# Patient Record
Sex: Female | Born: 1975 | Race: Black or African American | Hispanic: No | Marital: Single | Smoking: Never smoker
Health system: Southern US, Community
[De-identification: ages and names within clinical notes are randomized; demographics above are authoritative.]

## PROBLEM LIST (undated history)

## (undated) DIAGNOSIS — M1990 Unspecified osteoarthritis, unspecified site: Secondary | ICD-10-CM

## (undated) DIAGNOSIS — F419 Anxiety disorder, unspecified: Secondary | ICD-10-CM

## (undated) DIAGNOSIS — M542 Cervicalgia: Secondary | ICD-10-CM

## (undated) DIAGNOSIS — I10 Essential (primary) hypertension: Secondary | ICD-10-CM

## (undated) DIAGNOSIS — M62838 Other muscle spasm: Secondary | ICD-10-CM

## (undated) DIAGNOSIS — M797 Fibromyalgia: Secondary | ICD-10-CM

## (undated) HISTORY — DX: Anxiety disorder, unspecified: F41.9

## (undated) HISTORY — DX: Other muscle spasm: M62.838

## (undated) HISTORY — DX: Fibromyalgia: M79.7

## (undated) HISTORY — DX: Cervicalgia: M54.2

## (undated) HISTORY — DX: Unspecified osteoarthritis, unspecified site: M19.90

---

## 2014-11-27 DIAGNOSIS — N939 Abnormal uterine and vaginal bleeding, unspecified: Secondary | ICD-10-CM | POA: Insufficient documentation

## 2014-11-27 DIAGNOSIS — N390 Urinary tract infection, site not specified: Secondary | ICD-10-CM | POA: Insufficient documentation

## 2014-11-27 DIAGNOSIS — R102 Pelvic and perineal pain: Secondary | ICD-10-CM | POA: Insufficient documentation

## 2014-11-27 DIAGNOSIS — N938 Other specified abnormal uterine and vaginal bleeding: Secondary | ICD-10-CM | POA: Insufficient documentation

## 2015-10-25 DIAGNOSIS — Z8041 Family history of malignant neoplasm of ovary: Secondary | ICD-10-CM | POA: Insufficient documentation

## 2015-12-08 DIAGNOSIS — Z90710 Acquired absence of both cervix and uterus: Secondary | ICD-10-CM | POA: Insufficient documentation

## 2019-12-30 ENCOUNTER — Emergency Department (HOSPITAL_COMMUNITY): Payer: No Typology Code available for payment source

## 2019-12-30 ENCOUNTER — Emergency Department (HOSPITAL_COMMUNITY)
Admission: EM | Admit: 2019-12-30 | Discharge: 2019-12-30 | Disposition: A | Discharge status: Home / Self Care | Payer: No Typology Code available for payment source | Attending: Emergency Medicine | Admitting: Emergency Medicine

## 2019-12-30 DIAGNOSIS — Y999 Unspecified external cause status: Secondary | ICD-10-CM | POA: Diagnosis not present

## 2019-12-30 DIAGNOSIS — Y9389 Activity, other specified: Secondary | ICD-10-CM | POA: Diagnosis not present

## 2019-12-30 DIAGNOSIS — M542 Cervicalgia: Secondary | ICD-10-CM | POA: Insufficient documentation

## 2019-12-30 DIAGNOSIS — M79601 Pain in right arm: Secondary | ICD-10-CM | POA: Diagnosis not present

## 2019-12-30 DIAGNOSIS — M79641 Pain in right hand: Secondary | ICD-10-CM

## 2019-12-30 DIAGNOSIS — Y92410 Unspecified street and highway as the place of occurrence of the external cause: Secondary | ICD-10-CM | POA: Diagnosis not present

## 2019-12-30 MED ORDER — NAPROXEN 250 MG PO TABS
500.0000 mg | ORAL_TABLET | Freq: Once | ORAL | Status: AC
  Administered 2019-12-30: 500 mg via ORAL
  Filled 2019-12-30: qty 2

## 2019-12-30 MED ORDER — CYCLOBENZAPRINE HCL 10 MG PO TABS: 10.0000 mg | ORAL_TABLET | Freq: Three times a day (TID) | ORAL | 0 refills | Status: AC | PRN

## 2019-12-30 MED ORDER — NAPROXEN 375 MG PO TABS: 375.0000 mg | ORAL_TABLET | Freq: Two times a day (BID) | ORAL | 0 refills | Status: AC

## 2019-12-30 NOTE — Discharge Instructions (Signed)
Your x-ray today was within normal limits.  We placed your right wrist on a splint, please keep this on for comfort.  I prescribed a short course of anti-inflammatories along with muscle relaxers to help with your pain, please be aware this medication can make you drowsy, do not drink alcohol or drive while taking this medication.  The number to the [facility] health and wellness clinic is attached to your chart, please schedule an appointment for further follow-up on your blood pressure.
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
## 2019-12-30 NOTE — ED Notes (Signed)
Wrist brace applied, discharge paperwork given. Pt left before vitals could be obtained.

## 2020-04-11 ENCOUNTER — Encounter (HOSPITAL_COMMUNITY): Payer: Self-pay | Admitting: Emergency Medicine

## 2020-04-11 ENCOUNTER — Emergency Department (HOSPITAL_COMMUNITY)
Admission: EM | Admit: 2020-04-11 | Discharge: 2020-04-11 | Disposition: A | Discharge status: Home / Self Care | Payer: Self-pay | Attending: Emergency Medicine | Admitting: Emergency Medicine

## 2020-04-11 ENCOUNTER — Other Ambulatory Visit: Payer: Self-pay

## 2020-04-11 ENCOUNTER — Emergency Department (HOSPITAL_COMMUNITY): Payer: Self-pay

## 2020-04-11 DIAGNOSIS — N390 Urinary tract infection, site not specified: Secondary | ICD-10-CM

## 2020-04-11 LAB — URINALYSIS, ROUTINE W REFLEX MICROSCOPIC
Bilirubin Urine: NEGATIVE
Glucose, UA: NEGATIVE mg/dL
Ketones, ur: NEGATIVE mg/dL
Nitrite: NEGATIVE
Protein, ur: NEGATIVE mg/dL
Specific Gravity, Urine: 1.004 — ABNORMAL LOW (ref 1.005–1.030)
pH: 7 (ref 5.0–8.0)

## 2020-04-11 LAB — COMPREHENSIVE METABOLIC PANEL WITH GFR
ALT: 20 U/L (ref 0–44)
AST: 16 U/L (ref 15–41)
Albumin: 4 g/dL (ref 3.5–5.0)
Alkaline Phosphatase: 57 U/L (ref 38–126)
Anion gap: 10 (ref 5–15)
BUN: 9 mg/dL (ref 6–20)
CO2: 24 mmol/L (ref 22–32)
Calcium: 9.5 mg/dL (ref 8.9–10.3)
Chloride: 102 mmol/L (ref 98–111)
Creatinine, Ser: 0.8 mg/dL (ref 0.44–1.00)
GFR calc Af Amer: >60 mL/min
GFR calc non Af Amer: >60 mL/min
Glucose, Bld: 89 mg/dL (ref 70–99)
Potassium: 3.7 mmol/L (ref 3.5–5.1)
Sodium: 136 mmol/L (ref 135–145)
Total Bilirubin: 0.7 mg/dL (ref 0.3–1.2)
Total Protein: 7.3 g/dL (ref 6.5–8.1)

## 2020-04-11 LAB — CBC
HCT: 38.6 % (ref 36.0–46.0)
Hemoglobin: 12.2 g/dL (ref 12.0–15.0)
MCH: 28.2 pg (ref 26.0–34.0)
MCHC: 31.6 g/dL (ref 30.0–36.0)
MCV: 89.1 fL (ref 80.0–100.0)
Platelets: 307 10*3/uL (ref 150–400)
RBC: 4.33 MIL/uL (ref 3.87–5.11)
RDW: 14.2 % (ref 11.5–15.5)
WBC: 11.1 10*3/uL — ABNORMAL HIGH (ref 4.0–10.5)
nRBC: 0 % (ref 0.0–0.2)

## 2020-04-11 LAB — LIPASE, BLOOD: Lipase: 29 U/L (ref 11–51)

## 2020-04-11 LAB — I-STAT BETA HCG BLOOD, ED (MC, WL, AP ONLY): I-stat hCG, quantitative: <5 m[IU]/mL (ref <5)

## 2020-04-11 MED ORDER — CEPHALEXIN 500 MG PO CAPS: 500.0000 mg | ORAL_CAPSULE | Freq: Two times a day (BID) | ORAL | 0 refills | Status: AC

## 2020-04-11 MED ORDER — AMLODIPINE BESYLATE 10 MG PO TABS: 10.0000 mg | ORAL_TABLET | Freq: Every day | ORAL | 0 refills | Status: AC

## 2020-04-11 MED ORDER — SODIUM CHLORIDE 0.9% FLUSH: 3.0000 mL | Freq: Once | INTRAVENOUS | Status: DC

## 2020-04-11 MED ORDER — GABAPENTIN 100 MG PO CAPS: 100.0000 mg | ORAL_CAPSULE | Freq: Three times a day (TID) | ORAL | 0 refills | Status: AC

## 2020-04-11 MED ORDER — CEPHALEXIN 250 MG PO CAPS
500.0000 mg | ORAL_CAPSULE | Freq: Once | ORAL | Status: AC
  Administered 2020-04-11: 500 mg via ORAL
  Filled 2020-04-11: qty 2

## 2020-04-11 NOTE — Discharge Instructions (Signed)
Try to establish care with a primary care provider soon as possible.  Take the antibiotics as prescribed.  Return here as needed if you have any worsening symptoms.

## 2020-04-11 NOTE — ED Provider Notes (Signed)
[facility] EMERGENCY DEPARTMENT Provider Note   CSN: [id number] Arrival date & time: 04/11/20  1409     History Chief Complaint  Patient presents with  . Abdominal Pain  . Urinary Frequency    Donna Vance is a 44 y.o. female.  Patient is a 44 year old female who presents with right flank pain.  She has had 2-week history of intermittent pain in her right back that radiates to her right lower abdomen.  She has had some urinary frequency and burning on urination.  Her symptoms got worse over the last 2 to 3 days.  She denies any known fevers.  No nausea or vomiting.  No known history of kidney stones.  He does have a history of hypertension and is been out of her medicine for several months.  She currently does not have a PCP in town.  She recently moved from [place].  She has been using ibuprofen for her flank pain now has a little bit of increased reflux symptoms.  She is status post hysterectomy and denies vaginal complaints.        History reviewed. No pertinent past medical history.  There are no problems to display for this patient.   History reviewed. No pertinent surgical history.   OB History   No obstetric history on file.     No family history on file.  Social History   Tobacco Use  . Smoking status: Never Smoker  . Smokeless tobacco: Never Used  Substance Use Topics  . Alcohol use: Yes  . Drug use: Never    Home Medications Prior to Admission medications   Medication Sig Start Date End Date Taking? Authorizing Provider  amLODipine (NORVASC) 10 MG tablet Take 1 tablet (10 mg total) by mouth daily. 04/11/20   Rolan Bucco, MD  cephALEXin (KEFLEX) 500 MG capsule Take 1 capsule (500 mg total) by mouth 2 (two) times daily. 04/11/20   Rolan Bucco, MD  gabapentin (NEURONTIN) 100 MG capsule Take 1 capsule (100 mg total) by mouth 3 (three) times daily. 04/11/20   Rolan Bucco, MD    Allergies    Morphine and Latex  Review of  Systems   Review of Systems  Constitutional: Negative for chills, diaphoresis, fatigue and fever.  HENT: Negative for congestion, rhinorrhea and sneezing.   Eyes: Negative.   Respiratory: Negative for cough, chest tightness and shortness of breath.   Cardiovascular: Negative for chest pain and leg swelling.  Gastrointestinal: Positive for abdominal pain. Negative for blood in stool, diarrhea, nausea and vomiting.  Genitourinary: Positive for dysuria, flank pain and frequency. Negative for difficulty urinating and hematuria.  Musculoskeletal: Positive for back pain. Negative for arthralgias.  Skin: Negative for rash.  Neurological: Negative for dizziness, speech difficulty, weakness, numbness and headaches.    Physical Exam Updated Vital Signs BP (!) 177/104   Pulse 69   Temp 98.1 F (36.7 C) (Oral)   Resp 18   Ht 5\' 7"  (1.702 m)   Wt 81.6 kg   SpO2 100%   BMI 28.18 kg/m   Physical Exam Constitutional:      Appearance: She is well-developed.  HENT:     Head: Normocephalic and atraumatic.  Eyes:     Pupils: Pupils are equal, round, and reactive to light.  Cardiovascular:     Rate and Rhythm: Normal rate and regular rhythm.     Heart sounds: Normal heart sounds.  Pulmonary:     Effort: Pulmonary effort is normal. No respiratory  distress.     Breath sounds: Normal breath sounds. No wheezing or rales.  Chest:     Chest wall: No tenderness.  Abdominal:     General: Bowel sounds are normal.     Palpations: Abdomen is soft.     Tenderness: There is abdominal tenderness in the right lower quadrant. There is no guarding or rebound.  Musculoskeletal:        General: Normal range of motion.     Cervical back: Normal range of motion and neck supple.  Lymphadenopathy:     Cervical: No cervical adenopathy.  Skin:    General: Skin is warm and dry.     Findings: No rash.  Neurological:     Mental Status: She is alert and oriented to person, place, and time.     ED Results /  Procedures / Treatments   Labs (all labs ordered are listed, but only abnormal results are displayed) Labs Reviewed  CBC - Abnormal; Notable for the following components:      Result Value   WBC 11.1 (*)    All other components within normal limits  URINALYSIS, ROUTINE W REFLEX MICROSCOPIC - Abnormal; Notable for the following components:   APPearance CLOUDY (*)    Specific Gravity, Urine 1.004 (*)    Hgb urine dipstick SMALL (*)    Leukocytes,Ua TRACE (*)    Bacteria, UA RARE (*)    All other components within normal limits  URINE CULTURE  LIPASE, BLOOD  COMPREHENSIVE METABOLIC PANEL  I-STAT BETA HCG BLOOD, ED ([facility], [facility], [facility] ONLY)    EKG None  Radiology CT Renal Stone Study  Result Date: 04/11/2020 CLINICAL DATA:  Right flank pain radiating to the right lower quadrant EXAM: CT ABDOMEN AND PELVIS WITHOUT CONTRAST TECHNIQUE: Multidetector CT imaging of the abdomen and pelvis was performed following the standard protocol without IV contrast. COMPARISON:  None. FINDINGS: Lower chest: Lung bases are clear. Normal heart size. No pericardial effusion. Hepatobiliary: No visible focal liver lesions on this unenhanced CT. Normal liver attenuation with smooth surface contour. Gallbladder and biliary tree are unremarkable. Pancreas: Unremarkable. No pancreatic ductal dilatation or surrounding inflammatory changes. Spleen: Normal in size. No concerning splenic lesions. Adrenals/Urinary Tract: Normal adrenal glands. Kidneys are symmetric in size and normally located. No visible concerning renal lesions. No visible urolithiasis or hydronephrosis. Urinary bladder is largely decompressed at the time of exam and therefore poorly evaluated by CT imaging. No gross bladder abnormalities. Stomach/Bowel: Distal esophagus, stomach and duodenal sweep are unremarkable. No small bowel wall thickening or dilatation. No evidence of obstruction. A normal appendix is visualized. No colonic dilatation or wall thickening.  Vascular/Lymphatic: No significant vascular findings are present. No enlarged abdominal or pelvic lymph nodes. Reproductive: Uterus appears to be surgically absent. Correlate with surgical history. No concerning adnexal lesions. Other: No abdominopelvic free fluid or free gas. No bowel containing hernias. Small fat containing umbilical hernia. Musculoskeletal: No acute osseous abnormality or suspicious osseous lesion. Disc height loss with vacuum disc phenomenon L5-S1. IMPRESSION: 1. No acute intra-abdominal process to provide cause for patient's symptoms. Specifically, no visible urolithiasis or hydronephrosis with a normal appendix. Electronically Signed   By: Kreg Shropshire M.D.   On: 04/11/2020 22:32    Procedures Procedures (including critical care time)  Medications Ordered in ED Medications  sodium chloride flush (NS) 0.9 % injection 3 mL (has no administration in time range)  cephALEXin (KEFLEX) capsule 500 mg (has no administration in time range)    ED  Course  I have reviewed the triage vital signs and the nursing notes.  Pertinent labs & imaging results that were available during my care of the patient were reviewed by me and considered in my medical decision making (see chart for details).    MDM Rules/Calculators/A&P                          Patient is a 44 year old female who presents with right flank pain.  She also has some UTI symptoms.  Her urine has trace leukocyte esterase, few bacteria and moderate white blood cells.  It also had hematuria and given this, CT scan was performed to assess for kidney stones.  There is no evidence of kidney stones or hydronephrosis.  Her appendix appeared normal on CT scan.  No other acute abnormalities.  Her labs are reviewed and are nonconcerning.  She has normal renal function.  She is otherwise well-appearing.  She was discharged home in good condition.  She was started on Keflex for possible UTI.  She was given information about establishing  care with a PCP.  She was given a month supply of her outpatient medicines including amlodipine and gabapentin.  Return precautions were given. Final Clinical Impression(s) / ED Diagnoses Final diagnoses:  Lower urinary tract infectious disease    Rx / DC Orders ED Discharge Orders         Ordered    cephALEXin (KEFLEX) 500 MG capsule  2 times daily     Discontinue  Reprint     04/11/20 2244    amLODipine (NORVASC) 10 MG tablet  Daily     Discontinue  Reprint     04/11/20 2244    gabapentin (NEURONTIN) 100 MG capsule  3 times daily     Discontinue  Reprint     04/11/20 2244           Rolan Bucco, MD 04/11/20 2249

## 2020-04-11 NOTE — ED Notes (Signed)
Patient transported to CT 

## 2020-04-11 NOTE — ED Triage Notes (Signed)
Pt in w/RLQ pain x 1 mo, worsened since last night. States she has been trying home remedies to avoid the ED, but pain is 10/10, and she is n/v. Reporting frequent, scant urination as well. Had hysterectomy 17', still has appendix

## 2020-04-13 LAB — URINE CULTURE
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
## 2021-08-01 IMAGING — CT CT RENAL STONE PROTOCOL
2 of 4 series · 16 of 46 positions shown, 18 images · non-contrast
Comparison: None.

CLINICAL DATA: Right flank pain radiating to the right lower
quadrant

EXAM:
CT ABDOMEN AND PELVIS WITHOUT CONTRAST
TECHNIQUE: Multidetector CT imaging of the abdomen and pelvis was performed
following the standard protocol without IV contrast.

[Series 3: renal stone 5.0 · axial · 0.74mm/px · z∈[+879,+1274]mm · 13 of 87 slices shown, 15 images]
[im 4/87  soft-tissue]
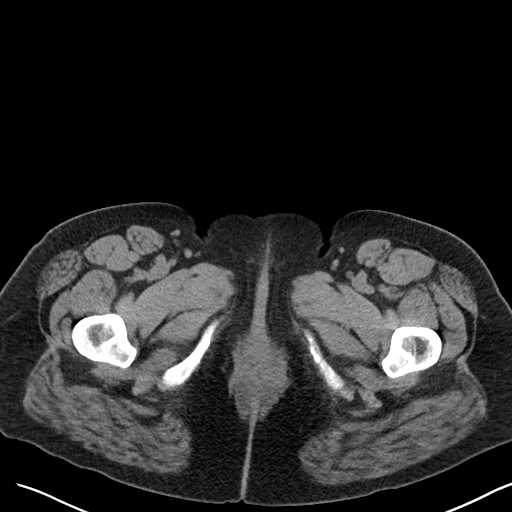
[im 4/87  bone]
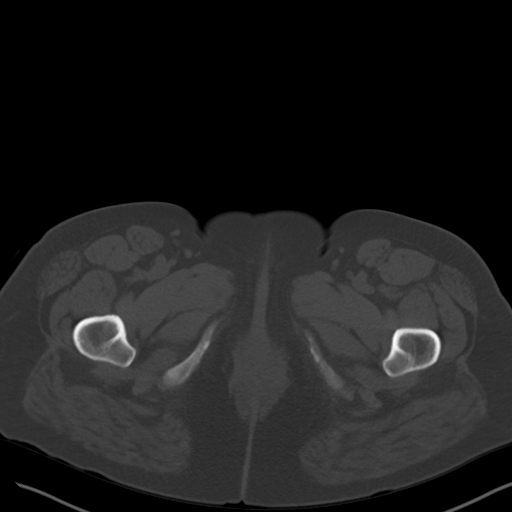
[im 11/87  soft-tissue]
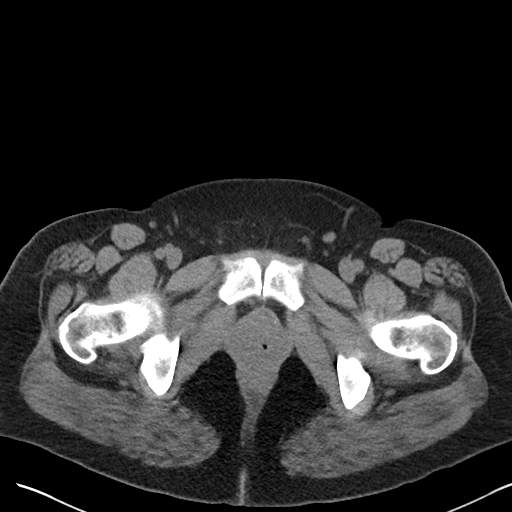
[im 18/87  soft-tissue]
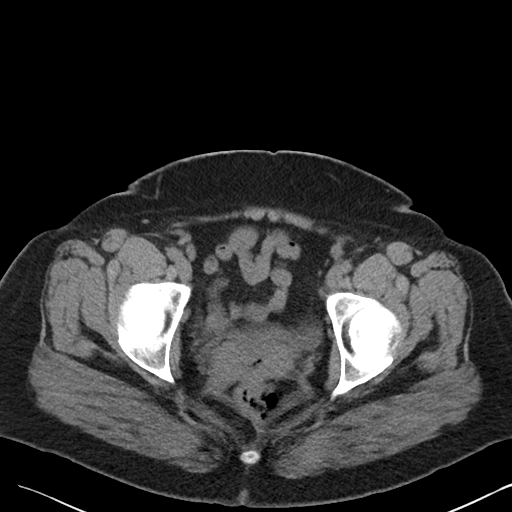
[im 25/87  soft-tissue]
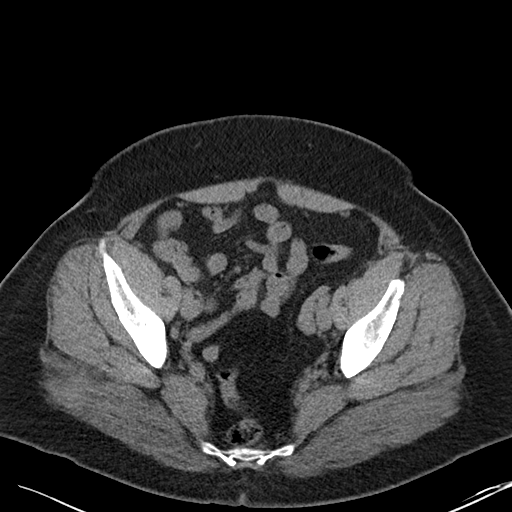
[im 31/87  soft-tissue]
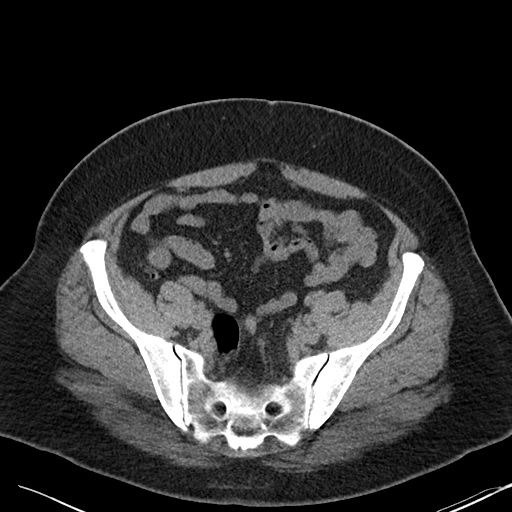
[im 38/87  soft-tissue]
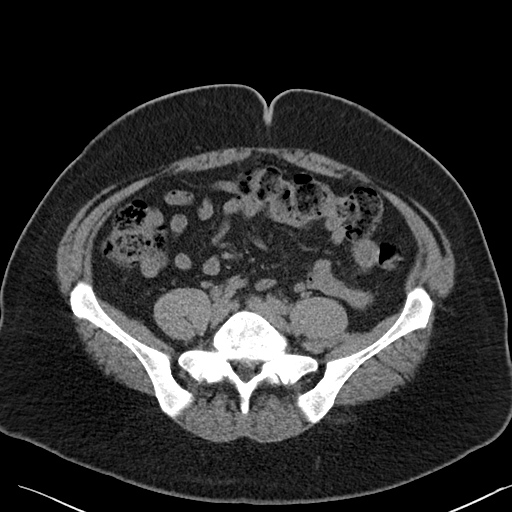
[im 45/87  soft-tissue]
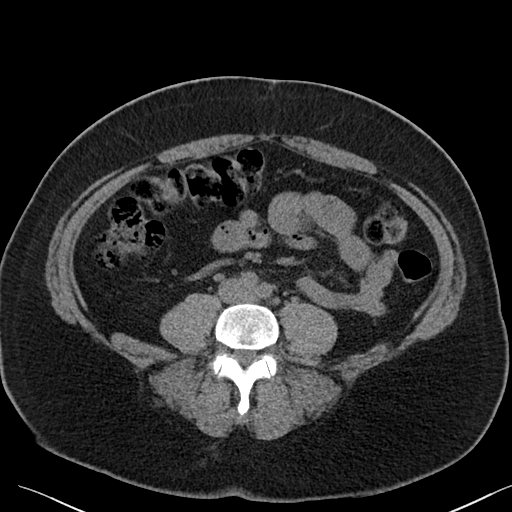
[im 49/87  soft-tissue]
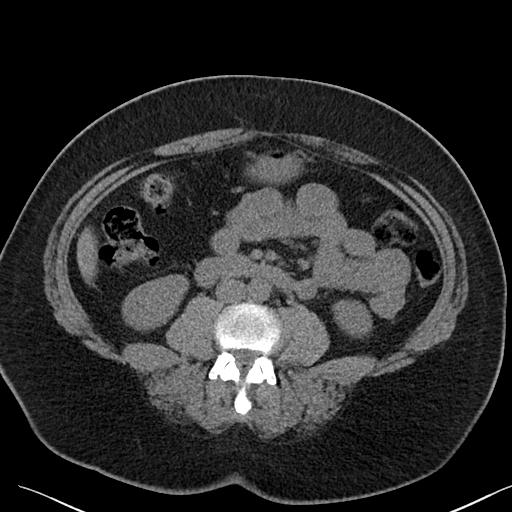
[im 56/87  soft-tissue]
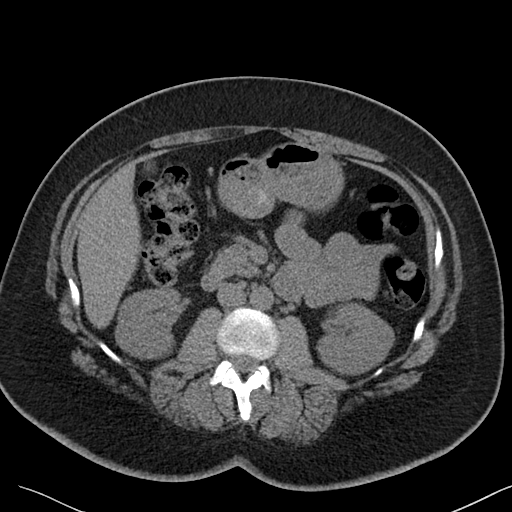
[im 56/87  bone]
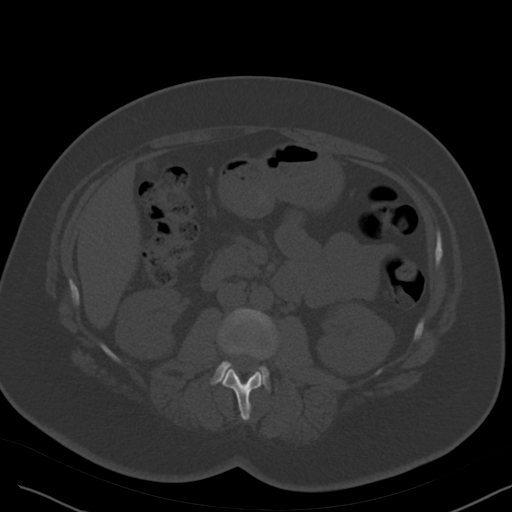
[im 62/87  soft-tissue]
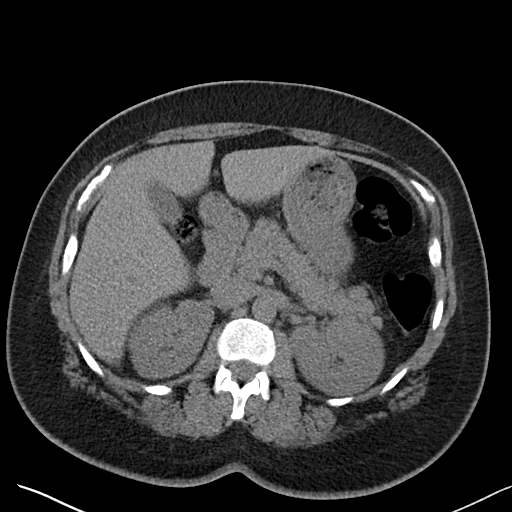
[im 69/87  soft-tissue]
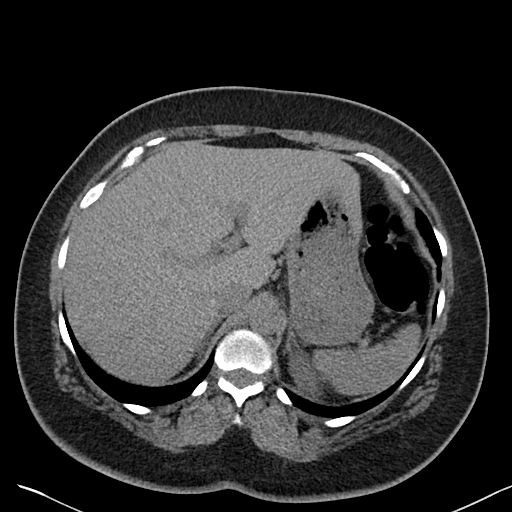
[im 76/87  soft-tissue]
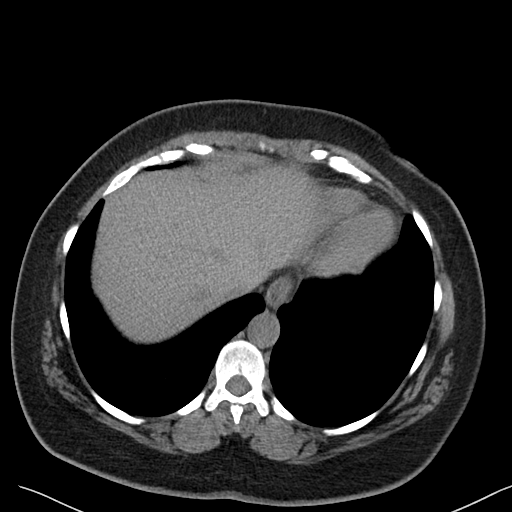
[im 83/87  soft-tissue]
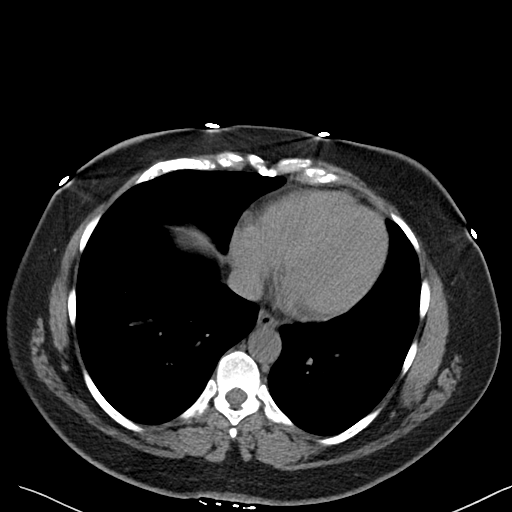

[Series 6: coronal · coronal · 0.77mm/px · 3 of 101 slices shown]
[im 34/101  soft-tissue]
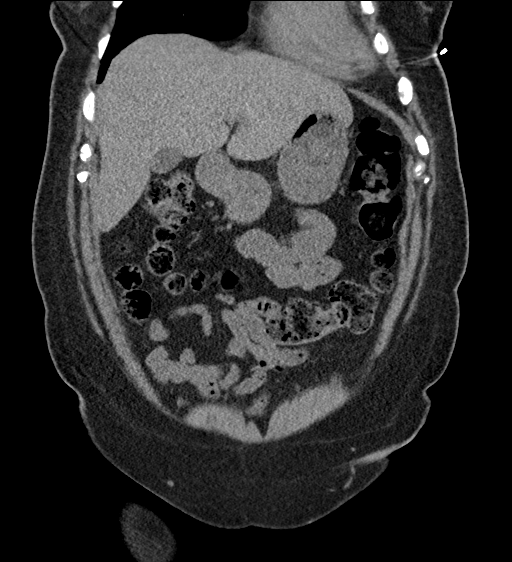
[im 45/101  soft-tissue]
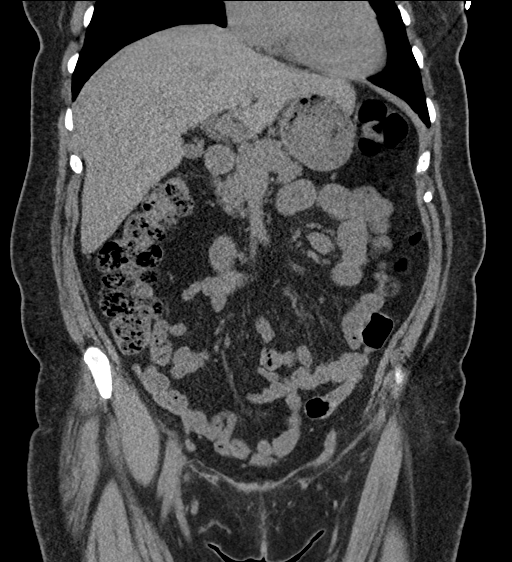
[im 56/101  soft-tissue]
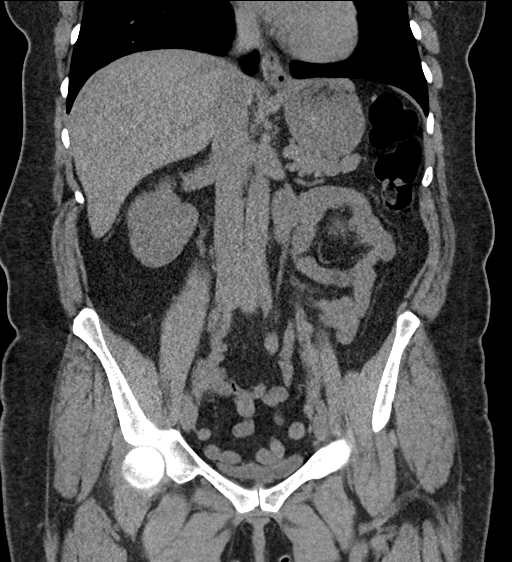

[16 of 46 positions shown; findings below may reference images not displayed]

FINDINGS: Lower chest: Lung bases are clear. Normal heart size. No pericardial
effusion.

Hepatobiliary: No visible focal liver lesions on this unenhanced CT.
Normal liver attenuation with smooth surface contour. Gallbladder
and biliary tree are unremarkable.

Pancreas: Unremarkable. No pancreatic ductal dilatation or
surrounding inflammatory changes.

Spleen: Normal in size. No concerning splenic lesions.

Adrenals/Urinary Tract: Normal adrenal glands. Kidneys are symmetric
in size and normally located. No visible concerning renal lesions.
No visible urolithiasis or hydronephrosis. Urinary bladder is
largely decompressed at the time of exam and therefore poorly
evaluated by CT imaging. No gross bladder abnormalities.

Stomach/Bowel: Distal esophagus, stomach and duodenal sweep are
unremarkable. No small bowel wall thickening or dilatation. No
evidence of obstruction. A normal appendix is visualized. No colonic
dilatation or wall thickening.

Vascular/Lymphatic: No significant vascular findings are present. No
enlarged abdominal or pelvic lymph nodes.

Reproductive: Uterus appears to be surgically absent. Correlate with
surgical history. No concerning adnexal lesions.

Other: No abdominopelvic free fluid or free gas. No bowel containing
hernias. Small fat containing umbilical hernia.

Musculoskeletal: No acute osseous abnormality or suspicious osseous
lesion. Disc height loss with vacuum disc phenomenon L5-S1.
IMPRESSION: 1. No acute intra-abdominal process to provide cause for patient's
symptoms. Specifically, no visible urolithiasis or hydronephrosis
with a normal appendix.

## 2023-01-22 ENCOUNTER — Encounter: Payer: Self-pay | Admitting: Physical Medicine and Rehabilitation

## 2023-01-22 ENCOUNTER — Ambulatory Visit (INDEPENDENT_AMBULATORY_CARE_PROVIDER_SITE_OTHER): Payer: Medicaid Other | Admitting: Physical Medicine and Rehabilitation

## 2023-01-22 DIAGNOSIS — M542 Cervicalgia: Secondary | ICD-10-CM

## 2023-01-22 DIAGNOSIS — M5416 Radiculopathy, lumbar region: Secondary | ICD-10-CM | POA: Diagnosis not present

## 2023-01-22 DIAGNOSIS — M797 Fibromyalgia: Secondary | ICD-10-CM | POA: Diagnosis not present

## 2023-01-22 DIAGNOSIS — M7918 Myalgia, other site: Secondary | ICD-10-CM

## 2023-01-22 MED ORDER — METHOCARBAMOL 500 MG PO TABS: 500.0000 mg | ORAL_TABLET | Freq: Three times a day (TID) | ORAL | 0 refills | Status: AC

## 2023-01-22 MED ORDER — MELOXICAM 15 MG PO TABS: 15.0000 mg | ORAL_TABLET | Freq: Every day | ORAL | 0 refills | Status: AC

## 2023-01-22 NOTE — Progress Notes (Unsigned)
Functional Pain Scale - descriptive words and definitions  Distressing (6)    Pain is present/unable to complete most ADLs limited by pain/sleep is difficult and active distraction is only marginal. Moderate range order  Average Pain  varies  Neck pain that radiates into upper back and shoulders. Hands, mainly right hand, can go numb
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
## 2023-02-06 ENCOUNTER — Telehealth: Payer: Self-pay

## 2023-02-06 NOTE — Telephone Encounter (Signed)
LVM to return call to schedule OV 

## 2023-02-06 NOTE — Telephone Encounter (Signed)
-----   Message from Donna Chance, NP sent at 01/22/2023  3:59 PM EDT ----- Can we schedule 6 week follow up? Thanks

## 2023-02-13 ENCOUNTER — Ambulatory Visit: Payer: 59 | Attending: Physical Medicine and Rehabilitation | Admitting: Physical Therapy

## 2023-02-13 ENCOUNTER — Other Ambulatory Visit: Payer: Self-pay

## 2023-02-13 ENCOUNTER — Encounter: Payer: Self-pay | Admitting: Physical Therapy

## 2023-02-13 DIAGNOSIS — M6281 Muscle weakness (generalized): Secondary | ICD-10-CM | POA: Diagnosis not present

## 2023-02-13 DIAGNOSIS — M5416 Radiculopathy, lumbar region: Secondary | ICD-10-CM | POA: Insufficient documentation

## 2023-02-13 DIAGNOSIS — M5459 Other low back pain: Secondary | ICD-10-CM | POA: Diagnosis not present

## 2023-02-13 DIAGNOSIS — M7918 Myalgia, other site: Secondary | ICD-10-CM | POA: Diagnosis not present

## 2023-02-13 DIAGNOSIS — M542 Cervicalgia: Secondary | ICD-10-CM
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
## 2023-02-19 ENCOUNTER — Telehealth: Payer: Self-pay | Admitting: Physical Therapy

## 2023-02-19 ENCOUNTER — Ambulatory Visit: Payer: 59 | Admitting: Physical Therapy

## 2023-02-19 NOTE — Therapy (Deleted)
Daily Note   Patient Name: Donna Vance MRN: [id number] DOB:May 25, 1976, 47 y.o., female Today's Date: 02/19/2023     No past medical history on file. No past surgical history on file. Patient Active Problem List   Diagnosis Date Noted   S/P vaginal hysterectomy 12/08/2015   FH: ovarian cancer in first degree relative 10/25/2015   Dysfunctional uterine bleeding 11/27/2014   Pelvic pain 11/27/2014   Urinary tract infection 11/27/2014   Vaginal bleeding 11/27/2014    PCP: Harlen Labs, NP  REFERRING PROVIDER: Harlen Labs, NP  THERAPY DIAG:  No diagnosis found.  REFERRING DIAG: Cervicalgia [M54.2], Myofascial pain syndrome [M79.18], Lumbar radiculopathy [M54.16]   Rationale for Evaluation and Treatment:  Rehabilitation  SUBJECTIVE:  PERTINENT PAST HISTORY:  none        PRECAUTIONS: None  WEIGHT BEARING RESTRICTIONS No  FALLS:  Has patient fallen in last 6 months? No, Number of falls: 0  MOI/History of condition:  Onset date: 2021  SUBJECTIVE STATEMENT  ***  Pain:  Are you having pain? Yes Pain location: neck, UT to mid thoracic spine NPRS scale:  2/10 to 9/10 Aggravating factors: clean house (vigorous), work (on computer all day) Relieving factors: muscle relaxer's Pain description: sharp and aching Stage: Chronic Stability: staying the same  Patient Goals/Specific Activities: improve pain   OBJECTIVE:   DIAGNOSTIC FINDINGS:  None recent   GENERAL OBSERVATION:  Slight forward head rounded shoulders     SENSATION:  Light touch: Appears intact   PALPATION: TTP sub occipitals, bil UT, bil LS  LUMBAR AROM  AROM AROM  02/19/2023  Flexion Fingertips to toes (WNL)  Extension WNL  Right lateral flexion WNL  Left lateral flexion WNL  Right rotation limited by 25%  Left rotation limited by 25%    (Blank rows = not tested)    Cervical ROM  ROM ROM  (Eval)  Flexion 60  Extension 40*  Right lateral flexion 45*   Left lateral flexion 45*  Right rotation 45*  Left rotation 50*  Flexion rotation (normal is 30 degrees)   Flexion rotation (normal is 30 degrees)     (Blank rows = not tested, N = WNL, * = concordant pain)  UPPER EXTREMITY MMT:  MMT Right 02/19/2023 Left 02/19/2023  Shoulder flexion 4+ 4+  Shoulder abduction (C5)    Shoulder ER    Shoulder IR    Middle trapezius 4 4  Lower trapezius 3+ 3+  Shoulder extension    Grip strength    Shoulder shrug (C4)    Elbow flexion (C6)    Elbow ext (C7)    Thumb ext (C8)    Finger abd (T1)    Grossly     (Blank rows = not tested, score listed is out of 5 possible points.  N = WNL, D = diminished, C = clear for gross weakness with myotome testing, * = concordant pain with testing)   SPECIAL TESTS:  Distraction (-) and spurlings (-) for radiculopathy  Slump (-)  JOINT MOBILITY TESTING:  Hypomobile cervical spine  PATIENT SURVEYS:  FOTO 59 -> 61 neck , 44 -> 53 low back    [facility] Adult PT Treatment:                                                DATE: 02/19/23/24  Therapeutic Exercise: ***  HOME EXERCISE PROGRAM: Access Code: [email or web address] URL: [email or web address] Date: 02/13/2023 Prepared by: Alphonzo Severance  Exercises - Supine Cervical Retraction with Towel  - 2 x daily - 7 x weekly - 2 sets - 10 reps - 5 second hold - Standing Shoulder Row with Anchored Resistance  - 1 x daily - 7 x weekly - 3 sets - 10 reps - Seated Isometric Cervical Sidebending  - 2 x daily - 7 x weekly - 10 reps - 10 second hold - Seated Isometric Cervical Extension  - 2 x daily - 7 x weekly - 10 reps - 10 second hold - Seated Isometric Cervical Rotation  - 1 x daily - 7 x weekly - 3 sets - 10 reps - 10 seconds hold - Seated Isometric Cervical Flexion  - 2 x daily - 7 x weekly - 10 reps - 10 second hold  Treatment priorities   Eval        DNF endurance        Manual TDN PRN        General neck and shoulder girdle strengthening          PNE/graded exposure        Look at LE/core strength visit 2          ASSESSMENT:  CLINICAL IMPRESSION: Donna Vance is a 47 y.o. female who presents to clinic with signs and sxs consistent with neck pain secondary to chronic WAD.  She has no signs of LE or UE radiculopathy on exam.  Neck and shoulder pain appears myofacial in origin.  Low back pain appears mechanical.  She is limited in her ability to complete housework and work without significant pain and can be limited to bed for multiple days at a time d/t pain.  She will benefit from skilled therapy to address listed deficits and improve overall function and activity tolerance.  OBJECTIVE IMPAIRMENTS: Pain, cervical ROM, DNF endurance, UE strength  ACTIVITY LIMITATIONS: working, reading, housework, lifting  PERSONAL FACTORS: See medical history and pertinent history   REHAB POTENTIAL: Fair chronic, severe pain  CLINICAL DECISION MAKING: Stable/uncomplicated  EVALUATION COMPLEXITY: Low   GOALS:   SHORT TERM GOALS: Target date: 03/13/2023   Donna Vance will be >75% HEP compliant to improve carryover between sessions and facilitate independent management of condition  Evaluation: ongoing Goal status: INITIAL   LONG TERM GOALS: Target date: 04/10/2023   Donna Vance will improve FOTO score to 61 for neck and 53 for low back as a proxy for functional improvement  Evaluation/Baseline: 59 neck, 44 low back Goal status: INITIAL   2.  Donna Vance will self report >/= 50% decrease in neck pain from evaluation   Evaluation/Baseline: 9/10 max pain Goal status: INITIAL   3.  Donna Vance will be able to clean her house, not limited by pain  Evaluation/Baseline: limited Goal status: INITIAL   4.  Donna Vance will report confidence in self management of condition at time of discharge with advanced HEP  Evaluation/Baseline: unable to self manage Goal status: INITIAL   5.  Donna Vance will report significant reduction in time spent in bed when not  sleeping at night  Evaluation/Baseline: limited Goal status: INITIAL    PLAN: PT FREQUENCY: 1-2x/week  PT DURATION: 8 weeks  PLANNED INTERVENTIONS: Therapeutic exercises, Aquatic therapy, Therapeutic activity, Neuro Muscular re-education, Gait training, Patient/Family education, Joint mobilization, Dry Needling, Electrical stimulation, Spinal mobilization and/or manipulation, Moist heat, Taping, Vasopneumatic device, Ionotophoresis 4mg /ml Dexamethasone, and Manual therapy   Alphonzo Severance PT, DPT 02/19/2023, 9:38 AM

## 2023-02-19 NOTE — Telephone Encounter (Signed)
Called about NS appt - no VM set up.  1 N/S

## 2023-02-26 ENCOUNTER — Ambulatory Visit: Payer: 59 | Admitting: Physical Therapy

## 2023-02-26 ENCOUNTER — Telehealth: Payer: Self-pay

## 2023-02-26 NOTE — Telephone Encounter (Signed)
-----   Message from Megan E Williams, NP sent at 01/22/2023  3:59 PM EDT ----- Can we schedule 6 week follow up? Thanks  

## 2023-02-26 NOTE — Telephone Encounter (Signed)
Attempted to call patient to schedule OV, call could not be completed

## 2023-03-05 ENCOUNTER — Telehealth: Payer: Self-pay | Admitting: Physical Therapy

## 2023-03-05 ENCOUNTER — Ambulatory Visit: Payer: 59 | Admitting: Physical Therapy

## 2023-03-05 ENCOUNTER — Telehealth: Payer: Self-pay

## 2023-03-05 NOTE — Telephone Encounter (Signed)
-----   Message from Megan E Williams, NP sent at 01/22/2023  3:59 PM EDT ----- Can we schedule 6 week follow up? Thanks  

## 2023-03-05 NOTE — Telephone Encounter (Signed)
LVM to return call to schedule OV 

## 2023-03-05 NOTE — Telephone Encounter (Signed)
Called and informed patient of missed visit and provided reminder of next appt and attendance policy. 2 n/s - may only schedule 1 visit at a time

## 2023-03-12 ENCOUNTER — Encounter (HOSPITAL_COMMUNITY): Payer: Self-pay | Admitting: Emergency Medicine

## 2023-03-12 ENCOUNTER — Ambulatory Visit: Payer: 59 | Admitting: Physical Therapy

## 2023-03-12 ENCOUNTER — Ambulatory Visit (HOSPITAL_COMMUNITY)
Admission: EM | Admit: 2023-03-12 | Discharge: 2023-03-12 | Disposition: A | Discharge status: Home / Self Care | Payer: 59 | Attending: Emergency Medicine | Admitting: Emergency Medicine

## 2023-03-12 ENCOUNTER — Other Ambulatory Visit: Payer: Self-pay

## 2023-03-12 DIAGNOSIS — N3000 Acute cystitis without hematuria: Secondary | ICD-10-CM | POA: Insufficient documentation

## 2023-03-12 HISTORY — DX: Essential (primary) hypertension: I10

## 2023-03-12 LAB — POCT URINALYSIS DIP (MANUAL ENTRY)
Bilirubin, UA: NEGATIVE
Glucose, UA: NEGATIVE mg/dL
Ketones, POC UA: NEGATIVE mg/dL
Leukocytes, UA: NEGATIVE
Nitrite, UA: NEGATIVE
Protein Ur, POC: NEGATIVE mg/dL
Spec Grav, UA: 1.015 (ref 1.010–1.025)
Urobilinogen, UA: 0.2 E.U./dL
pH, UA: 7.5 (ref 5.0–8.0)

## 2023-03-12 MED ORDER — NITROFURANTOIN MONOHYD MACRO 100 MG PO CAPS: 100.0000 mg | ORAL_CAPSULE | Freq: Two times a day (BID) | ORAL | 0 refills | Status: AC

## 2023-03-12 MED ORDER — PHENAZOPYRIDINE HCL 95 MG PO TABS: 95.0000 mg | ORAL_TABLET | Freq: Three times a day (TID) | ORAL | 0 refills | Status: AC | PRN

## 2023-03-12 NOTE — ED Provider Notes (Signed)
[facility]    CSN: [id number] Arrival date & time: 03/12/23  4098      History   Chief Complaint Chief Complaint  Patient presents with   Dysuria    Pt c/o frequency and dysuria for the past 3 days with right side flank pain.    HPI Donna Vance is a 47 y.o. female.   Patient presents to clinic for complaints of urinary frequency that has been worsening since Sunday.  She is unable to sleep last night due to needing to urinate every hour.  She feels like she is having incomplete emptying and has some dribbling after urination.  She denies any dysuria or hematuria.  Does endorse suprapubic pain and pressure.  She has some nausea without emesis.  Right flank pain.  She did take ibuprofen last night.  Has been drinking a lot of Pepsi recently.  Denies any emesis or fevers.   The history is provided by the patient and medical records.  Dysuria Associated symptoms: abdominal pain, flank pain and nausea   Associated symptoms: no fever and no vomiting     Past Medical History:  Diagnosis Date   Hypertension     Patient Active Problem List   Diagnosis Date Noted   S/P vaginal hysterectomy 12/08/2015   FH: ovarian cancer in first degree relative 10/25/2015   Dysfunctional uterine bleeding 11/27/2014   Pelvic pain 11/27/2014   Urinary tract infection 11/27/2014   Vaginal bleeding 11/27/2014    History reviewed. No pertinent surgical history.  OB History   No obstetric history on file.      Home Medications    Prior to Admission medications   Medication Sig Start Date End Date Taking? Authorizing Provider  nitrofurantoin, macrocrystal-monohydrate, (MACROBID) 100 MG capsule Take 1 capsule (100 mg total) by mouth 2 (two) times daily. 03/12/23  Yes Rinaldo Ratel, [place] N, FNP  phenazopyridine (PYRIDIUM) 95 MG tablet Take 1 tablet (95 mg total) by mouth 3 (three) times daily as needed for pain. 03/12/23  Yes Rinaldo Ratel, [place] N, FNP  amLODipine (NORVASC) 10 MG  tablet Take 1 tablet (10 mg total) by mouth daily. 04/11/20   Rolan Bucco, MD  cephALEXin (KEFLEX) 500 MG capsule Take 1 capsule (500 mg total) by mouth 2 (two) times daily. 04/11/20   Rolan Bucco, MD  gabapentin (NEURONTIN) 100 MG capsule Take 1 capsule (100 mg total) by mouth 3 (three) times daily. 04/11/20   Rolan Bucco, MD  meloxicam (MOBIC) 15 MG tablet Take 1 tablet (15 mg total) by mouth daily. 01/22/23 01/22/24  Juanda Chance, NP  methocarbamol (ROBAXIN) 500 MG tablet Take 1 tablet (500 mg total) by mouth 3 (three) times daily. 01/22/23   Juanda Chance, NP    Family History History reviewed. No pertinent family history.  Social History Social History   Tobacco Use   Smoking status: Never   Smokeless tobacco: Never  Substance Use Topics   Alcohol use: Yes   Drug use: Never     Allergies   Morphine and Latex   Review of Systems Review of Systems  Constitutional:  Negative for fatigue and fever.  Gastrointestinal:  Positive for abdominal pain and nausea. Negative for vomiting.  Genitourinary:  Positive for flank pain, frequency and urgency. Negative for difficulty urinating and dysuria.     Physical Exam Triage Vital Signs ED Triage Vitals  Enc Vitals Group     BP 03/12/23 0917 (!) 163/114     Pulse Rate 03/12/23 0917  86     Resp 03/12/23 0917 18     Temp 03/12/23 0917 98.2 F (36.8 C)     Temp Source 03/12/23 0917 Oral     SpO2 03/12/23 0917 96 %     Weight 03/12/23 0918 180 lb 12.4 oz (82 kg)     Height 03/12/23 0918 5\' 7"  (1.702 m)     Head Circumference --      Peak Flow --      Pain Score 03/12/23 0918 5     Pain Loc --      Pain Edu? --      Excl. in GC? --    No data found.  Updated Vital Signs BP (!) 163/114 (BP Location: Right Arm)   Pulse 86   Temp 98.2 F (36.8 C) (Oral)   Resp 18   Ht 5\' 7"  (1.702 m)   Wt 180 lb 12.4 oz (82 kg)   SpO2 96%   BMI 28.31 kg/m   Visual Acuity Right Eye Distance:   Left Eye Distance:    Bilateral Distance:    Right Eye Near:   Left Eye Near:    Bilateral Near:     Physical Exam Vitals and nursing note reviewed.  Constitutional:      Appearance: Normal appearance.  HENT:     Head: Normocephalic and atraumatic.     Right Ear: External ear normal.     Left Ear: External ear normal.     Nose: Nose normal.     Mouth/Throat:     Mouth: Mucous membranes are moist.  Eyes:     Conjunctiva/sclera: Conjunctivae normal.  Cardiovascular:     Rate and Rhythm: Normal rate.  Pulmonary:     Effort: Pulmonary effort is normal. No respiratory distress.  Abdominal:     General: Abdomen is flat.     Tenderness: There is abdominal tenderness in the suprapubic area. There is no right CVA tenderness or left CVA tenderness.     Comments: Mild suprapubic tenderness.  Abdomen without rebound, guarding or palpable masses.  Musculoskeletal:        General: Normal range of motion.  Skin:    General: Skin is warm and dry.  Neurological:     Mental Status: She is alert.  Psychiatric:        Behavior: Behavior is cooperative.      UC Treatments / Results  Labs (all labs ordered are listed, but only abnormal results are displayed) Labs Reviewed  POCT URINALYSIS DIP (MANUAL ENTRY) - Abnormal; Notable for the following components:      Result Value   Blood, UA trace-intact (*)    All other components within normal limits  URINE CULTURE    EKG   Radiology No results found.  Procedures Procedures (including critical care time)  Medications Ordered in UC Medications - No data to display  Initial Impression / Assessment and Plan / UC Course  I have reviewed the triage vital signs and the nursing notes.  Pertinent labs & imaging results that were available during my care of the patient were reviewed by me and considered in my medical decision making (see chart for details).  Vitals and triage reviewed, patient is hemodynamically stable.  Urgency, frequency and incomplete  emptying that has been worsening since Sunday.  Urinalysis significant for red blood cells.  Will send for culture.  Suprapubic tenderness to palpation.  Without rebound, guarding or palpable masses, low concern for acute abdomen.  Negative for  CVA tenderness.  Afebrile.  Without tachycardia or emesis, low concern for pyelonephritis.  Will cover with Macrobid for acute cystitis without hematuria.  Encouraged to diminish soda intake and increase water intake.  Plan of care, follow-up care and return precautions given, no questions at this time.     Final Clinical Impressions(s) / UC Diagnoses   Final diagnoses:  Acute cystitis without hematuria     Discharge Instructions      Please take all antibiotics as prescribed until finished.  You can take the Pyridium up to 3 times daily as needed for pain and discomfort.  Ensure you are drinking at least 64 ounces of water daily.  Please cut back on her soda intake, as this could potentially trigger your urinary tract infection.  Return to clinic if you have no improvement, worsening flank pain, fever, vomiting, or any new concerning symptoms.  We are sending your urine off for culture and we will contact you if we need to modify your antibiotic therapy.      ED Prescriptions     Medication Sig Dispense Auth. Provider   nitrofurantoin, macrocrystal-monohydrate, (MACROBID) 100 MG capsule Take 1 capsule (100 mg total) by mouth 2 (two) times daily. 10 capsule Ryland Tungate, [place] N, FNP   phenazopyridine (PYRIDIUM) 95 MG tablet Take 1 tablet (95 mg total) by mouth 3 (three) times daily as needed for pain. 10 tablet Sabastion Hrdlicka, [place] N, FNP      PDMP not reviewed this encounter.   Rinaldo Ratel [place] N, [place] 03/12/23 [phone]

## 2023-03-12 NOTE — ED Triage Notes (Signed)
Pt c/o frequency and dysuria for the past 3 days with right side flank pain.

## 2023-03-12 NOTE — Discharge Instructions (Addendum)
Please take all antibiotics as prescribed until finished.  You can take the Pyridium up to 3 times daily as needed for pain and discomfort.  Ensure you are drinking at least 64 ounces of water daily.  Please cut back on her soda intake, as this could potentially trigger your urinary tract infection.  Return to clinic if you have no improvement, worsening flank pain, fever, vomiting, or any new concerning symptoms.  We are sending your urine off for culture and we will contact you if we need to modify your antibiotic therapy.

## 2023-03-13 LAB — URINE CULTURE: Culture: NO GROWTH

## 2023-05-15 ENCOUNTER — Other Ambulatory Visit: Payer: Self-pay | Admitting: Physical Medicine and Rehabilitation

## 2023-05-15 DIAGNOSIS — I10 Essential (primary) hypertension: Secondary | ICD-10-CM | POA: Diagnosis not present

## 2023-05-15 DIAGNOSIS — R008 Other abnormalities of heart beat: Secondary | ICD-10-CM | POA: Diagnosis not present

## 2023-05-15 DIAGNOSIS — M545 Low back pain, unspecified: Secondary | ICD-10-CM | POA: Diagnosis not present

## 2023-05-15 DIAGNOSIS — N3021 Other chronic cystitis with hematuria: Secondary | ICD-10-CM | POA: Diagnosis not present

## 2023-05-15 DIAGNOSIS — R350 Frequency of micturition: Secondary | ICD-10-CM | POA: Diagnosis not present

## 2023-05-15 DIAGNOSIS — B3731 Acute candidiasis of vulva and vagina: Secondary | ICD-10-CM | POA: Diagnosis not present

## 2023-05-15 DIAGNOSIS — R319 Hematuria, unspecified: Secondary | ICD-10-CM | POA: Diagnosis not present

## 2023-07-30 ENCOUNTER — Ambulatory Visit: Payer: 59 | Admitting: Internal Medicine

## 2023-10-11 ENCOUNTER — Ambulatory Visit: Payer: Self-pay | Attending: Internal Medicine | Admitting: Internal Medicine

## 2023-10-11 NOTE — Progress Notes (Deleted)
  Cardiology Office Note:  .    Date:  10/11/2023  ID:  Donna Vance, DOB 11/17/1975, MRN [id number] PCP: Harlen Labs, NP  [facility] Providers Cardiologist:  None { Click to update primary MD,subspecialty MD or APP then REFRESH:1}    CC: *** Consulted for the evaluation of *** at the behest of ***   History of Present Illness: .    Donna Vance is a 48 y.o. female ***  @Discussed  the use of AI scribe software for clinical note transcription with the patient, who gave verbal consent to proceed.  History of Present Illness             Relevant histories: .  Social *** ROS: As per HPI.   Studies Reviewed: .       Results           *** Risk Assessment/Calculations:    {Does this patient have ATRIAL FIBRILLATION?:[phone]}       Physical Exam:    VS:  There were no vitals taken for this visit.   Wt Readings from Last 3 Encounters:  03/12/23 180 lb 12.4 oz (82 kg)  04/11/20 179 lb 14.3 oz (81.6 kg)  12/30/19 172 lb (78 kg)    Gen: *** distress, *** obese/well nourished/malnourished   Neck: No JVD, *** carotid bruit Ears: *** Frank Sign Cardiac: No Rubs or Gallops, *** Murmur, ***cardia, *** radial pulses Respiratory: Clear to auscultation bilaterally, *** effort, ***  respiratory rate GI: Soft, nontender, non-distended *** MS: No *** edema; *** moves all extremities Integument: Skin feels *** Neuro:  At time of evaluation, alert and oriented to person/place/time/situation *** Psych: Normal affect, patient feels ***   ASSESSMENT AND PLAN: .    *** An EKG was ordered for *** and shows ***  HTN  Palpitations  PRN  Riley Lam, MD FASE [facility] Cardiologist [facility]  [address] [place], [address] [place], [place] [address] [phone]  12:45 PM

## 2023-10-14 ENCOUNTER — Encounter: Payer: Self-pay | Admitting: Internal Medicine
